# Patient Record
Sex: Female | Born: 2000 | Race: White | Hispanic: No | Marital: Single | Smoking: Former smoker
Health system: Southern US, Community
[De-identification: ages and names within clinical notes are randomized; demographics above are authoritative.]

## PROBLEM LIST (undated history)

## (undated) DIAGNOSIS — J45909 Unspecified asthma, uncomplicated: Secondary | ICD-10-CM

---

## 2018-10-25 ENCOUNTER — Encounter (HOSPITAL_COMMUNITY): Payer: Self-pay | Admitting: Emergency Medicine

## 2018-10-25 ENCOUNTER — Other Ambulatory Visit: Payer: Self-pay

## 2018-10-25 ENCOUNTER — Emergency Department (HOSPITAL_COMMUNITY)
Admission: EM | Admit: 2018-10-25 | Discharge: 2018-10-25 | Disposition: A | Discharge status: Home / Self Care | Payer: Managed Care, Other (non HMO) | Attending: Emergency Medicine | Admitting: Emergency Medicine

## 2018-10-25 ENCOUNTER — Emergency Department (HOSPITAL_COMMUNITY): Payer: Managed Care, Other (non HMO)

## 2018-10-25 DIAGNOSIS — B349 Viral infection, unspecified: Secondary | ICD-10-CM | POA: Diagnosis not present

## 2018-10-25 DIAGNOSIS — Z87891 Personal history of nicotine dependence: Secondary | ICD-10-CM | POA: Diagnosis not present

## 2018-10-25 DIAGNOSIS — R0602 Shortness of breath: Secondary | ICD-10-CM | POA: Insufficient documentation

## 2018-10-25 DIAGNOSIS — Z79899 Other long term (current) drug therapy: Secondary | ICD-10-CM | POA: Insufficient documentation

## 2018-10-25 HISTORY — DX: Unspecified asthma, uncomplicated: J45.909

## 2018-10-25 LAB — CBC WITH DIFFERENTIAL/PLATELET
Abs Immature Granulocytes: 0.03 10*3/uL (ref 0.00–0.07)
Basophils Absolute: 0 10*3/uL (ref 0.0–0.1)
Basophils Relative: 0 %
Eosinophils Absolute: 0.1 10*3/uL (ref 0.0–0.5)
Eosinophils Relative: 1 %
HCT: 40.8 % (ref 36.0–46.0)
Hemoglobin: 12.5 g/dL (ref 12.0–15.0)
Immature Granulocytes: 0 %
Lymphocytes Relative: 30 %
Lymphs Abs: 2.8 10*3/uL (ref 0.7–4.0)
MCH: 27.4 pg (ref 26.0–34.0)
MCHC: 30.6 g/dL (ref 30.0–36.0)
MCV: 89.3 fL (ref 80.0–100.0)
Monocytes Absolute: 0.5 10*3/uL (ref 0.1–1.0)
Monocytes Relative: 6 %
Neutro Abs: 5.6 10*3/uL (ref 1.7–7.7)
Neutrophils Relative %: 63 %
Platelets: 277 10*3/uL (ref 150–400)
RBC: 4.57 MIL/uL (ref 3.87–5.11)
RDW: 12.4 % (ref 11.5–15.5)
WBC: 9.1 10*3/uL (ref 4.0–10.5)
nRBC: 0 % (ref 0.0–0.2)

## 2018-10-25 LAB — BASIC METABOLIC PANEL
Anion gap: 8 (ref 5–15)
BUN: 11 mg/dL (ref 6–20)
CO2: 23 mmol/L (ref 22–32)
Calcium: 9.5 mg/dL (ref 8.9–10.3)
Chloride: 108 mmol/L (ref 98–111)
Creatinine, Ser: 0.73 mg/dL (ref 0.44–1.00)
GFR calc Af Amer: >60 mL/min (ref >60)
GFR calc non Af Amer: >60 mL/min (ref >60)
Glucose, Bld: 100 mg/dL — ABNORMAL HIGH (ref 70–99)
Potassium: 4.9 mmol/L (ref 3.5–5.1)
Sodium: 139 mmol/L (ref 135–145)

## 2018-10-25 LAB — I-STAT BETA HCG BLOOD, ED (MC, WL, AP ONLY): I-stat hCG, quantitative: <5 m[IU]/mL (ref <5)

## 2018-10-25 MED ORDER — SODIUM CHLORIDE 0.9 % IV BOLUS
1000.0000 mL | Freq: Once | INTRAVENOUS | Status: AC
  Administered 2018-10-25: 17:00:00 1000 mL via INTRAVENOUS

## 2018-10-25 NOTE — Discharge Instructions (Addendum)
You likely have a viral illness.  In this pandemic, you could have COVID-19.  However all of your testing appears well and you should self quarantine at home.  If you develop worsening shortness of breath then you should return to the emergency department.  Otherwise, quarantine yourself for 7 days and you must be symptom-free for at least 3 days before he can stop.     Person Under Monitoring Name: Heather Mcdowell  Location: [address] [place] [address]   Infection Prevention Recommendations for Individuals Confirmed to have, or Being Evaluated for, 2019 Novel Coronavirus (COVID-19) Infection Who Receive Care at Home  Individuals who are confirmed to have, or are being evaluated for, COVID-19 should follow the prevention steps below until a healthcare provider or local or state health department says they can return to normal activities.  Stay home except to get medical care You should restrict activities outside your home, except for getting medical care. Do not go to work, school, or public areas, and do not use public transportation or taxis.  Call ahead before visiting your doctor Before your medical appointment, call the healthcare provider and tell them that you have, or are being evaluated for, COVID-19 infection. This will help the healthcare providers office take steps to keep other people from getting infected. Ask your healthcare provider to call the local or state health department.  Monitor your symptoms Seek prompt medical attention if your illness is worsening (e.g., difficulty breathing). Before going to your medical appointment, call the healthcare provider and tell them that you have, or are being evaluated for, COVID-19 infection. Ask your healthcare provider to call the local or state health department.  Wear a facemask You should wear a facemask that covers your nose and mouth when you are in the same room with other people and when you  visit a healthcare provider. People who live with or visit you should also wear a facemask while they are in the same room with you.  Separate yourself from other people in your home As much as possible, you should stay in a different room from other people in your home. Also, you should use a separate bathroom, if available.  Avoid sharing household items You should not share dishes, drinking glasses, cups, eating utensils, towels, bedding, or other items with other people in your home. After using these items, you should wash them thoroughly with soap and water.  Cover your coughs and sneezes Cover your mouth and nose with a tissue when you cough or sneeze, or you can cough or sneeze into your sleeve. Throw used tissues in a lined trash can, and immediately wash your hands with soap and water for at least 20 seconds or use an alcohol-based hand rub.  Wash your [employer] your hands often and thoroughly with soap and water for at least 20 seconds. You can use an alcohol-based hand sanitizer if soap and water are not available and if your hands are not visibly dirty. Avoid touching your eyes, nose, and mouth with unwashed hands.   Prevention Steps for Caregivers and Household Members of Individuals Confirmed to have, or Being Evaluated for, COVID-19 Infection Being Cared for in the Home  If you live with, or provide care at home for, a person confirmed to have, or being evaluated for, COVID-19 infection please follow these guidelines to prevent infection:  Follow healthcare providers instructions Make sure that you understand and can help the patient follow any healthcare  provider instructions for all care.  Provide for the patients basic needs You should help the patient with basic needs in the home and provide support for getting groceries, prescriptions, and other personal needs.  Monitor the patients symptoms If they are getting sicker, call his or her medical provider and  tell them that the patient has, or is being evaluated for, COVID-19 infection. This will help the healthcare providers office take steps to keep other people from getting infected. Ask the healthcare provider to call the local or state health department.  Limit the number of people who have contact with the patient If possible, have only one caregiver for the patient. Other household members should stay in another home or place of residence. If this is not possible, they should stay in another room, or be separated from the patient as much as possible. Use a separate bathroom, if available. Restrict visitors who do not have an essential need to be in the home.  Keep older adults, very young children, and other sick people away from the patient Keep older adults, very young children, and those who have compromised immune systems or chronic health conditions away from the patient. This includes people with chronic heart, lung, or kidney conditions, diabetes, and cancer.  Ensure good ventilation Make sure that shared spaces in the home have good air flow, such as from an air conditioner or an opened window, weather permitting.  Wash your hands often Wash your hands often and thoroughly with soap and water for at least 20 seconds. You can use an alcohol based hand sanitizer if soap and water are not available and if your hands are not visibly dirty. Avoid touching your eyes, nose, and mouth with unwashed hands. Use disposable paper towels to dry your hands. If not available, use dedicated cloth towels and replace them when they become wet.  Wear a facemask and gloves Wear a disposable facemask at all times in the room and gloves when you touch or have contact with the patients blood, body fluids, and/or secretions or excretions, such as sweat, saliva, sputum, nasal mucus, vomit, urine, or feces.  Ensure the mask fits over your nose and mouth tightly, and do not touch it during use. Throw out  disposable facemasks and gloves after using them. Do not reuse. Wash your hands immediately after removing your facemask and gloves. If your personal clothing becomes contaminated, carefully remove clothing and launder. Wash your hands after handling contaminated clothing. Place all used disposable facemasks, gloves, and other waste in a lined container before disposing them with other household waste. Remove gloves and wash your hands immediately after handling these items.  Do not share dishes, glasses, or other household items with the patient Avoid sharing household items. You should not share dishes, drinking glasses, cups, eating utensils, towels, bedding, or other items with a patient who is confirmed to have, or being evaluated for, COVID-19 infection. After the person uses these items, you should wash them thoroughly with soap and water.  Wash laundry thoroughly Immediately remove and wash clothes or bedding that have blood, body fluids, and/or secretions or excretions, such as sweat, saliva, sputum, nasal mucus, vomit, urine, or feces, on them. Wear gloves when handling laundry from the patient. Read and follow directions on labels of laundry or clothing items and detergent. In general, wash and dry with the warmest temperatures recommended on the label.  Clean all areas the individual has used often Clean all touchable surfaces, such as counters, tabletops, doorknobs,  bathroom fixtures, toilets, phones, keyboards, tablets, and bedside tables, every day. Also, clean any surfaces that may have blood, body fluids, and/or secretions or excretions on them. Wear gloves when cleaning surfaces the patient has come in contact with. Use a diluted bleach solution (e.g., dilute bleach with 1 part bleach and 10 parts water) or a household disinfectant with a label that says EPA-registered for coronaviruses. To make a bleach solution at home, add 1 tablespoon of bleach to 1 quart (4 cups) of water.  For a larger supply, add  cup of bleach to 1 gallon (16 cups) of water. Read labels of cleaning products and follow recommendations provided on product labels. Labels contain instructions for safe and effective use of the cleaning product including precautions you should take when applying the product, such as wearing gloves or eye protection and making sure you have good ventilation during use of the product. Remove gloves and wash hands immediately after cleaning.  Monitor yourself for signs and symptoms of illness Caregivers and household members are considered close contacts, should monitor their health, and will be asked to limit movement outside of the home to the extent possible. Follow the monitoring steps for close contacts listed on the symptom monitoring form.   ? If you have additional questions, contact your local health department or call the epidemiologist on call at [phone] (available 24/7). ? This guidance is subject to change. For the most up-to-date guidance from [facility], please refer to their website: [email or web address]

## 2018-10-25 NOTE — ED Triage Notes (Addendum)
Pt reports 4 days ago diarrhea with abd cramping, sore throat, reports weight on her chest today and SOB, itchy throat and occasional congested cough, fever of 99.

## 2018-10-25 NOTE — ED Provider Notes (Signed)
[facility] EMERGENCY DEPARTMENT Provider Note   CSN: [id number] Arrival date & time: 10/25/18  1554    History   Chief Complaint Chief Complaint  Patient presents with  . Shortness of Breath  . Sore Throat    HPI Heather Mcdowell is a 18 y.o. female.     HPI  18 year old female presents with diarrhea and shortness of breath.  She states she is been feeling poorly for 5 days with numerous diarrheal bowel movements.  She also has abdominal cramping that comes only when she is having a bowel movement.  Some nausea without vomiting.  No fevers except low-grade 99 temperature.  A little bit of trace cough and some chest pressure with shortness of breath over the last day or so.  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  Past Medical History:  Diagnosis Date  . Asthma    as a child    There are no active problems to display for this patient.   History reviewed. No pertinent surgical history.   OB History    Gravida  1   Para      Term      Preterm      AB      Living        SAB      TAB      Ectopic      Multiple      Live Births               Home Medications    Prior to Admission medications   Medication Sig Start Date End Date Taking? Authorizing Provider  BIOTIN PO Take 1 tablet by mouth daily.   Yes [provider]  Etonogestrel (NEXPLANON [place]) Inject into the skin. June 2019   Yes [provider]    Family History History reviewed. No pertinent family history.  Social History Social History   Tobacco Use  . Smoking status: Former Smoker    Types: Cigarettes  . Smokeless tobacco: Never Used  . Tobacco comment: quit 2018  Substance Use Topics  . Alcohol use: Not Currently  . Drug use: Never     Allergies   Patient has no known allergies.   Review of Systems Review of Systems  Constitutional: Positive for fatigue. Negative for  fever.  Respiratory: Positive for cough and shortness of breath.   Cardiovascular: Positive for chest pain.  Gastrointestinal: Positive for abdominal pain, diarrhea and nausea. Negative for vomiting.  Neurological: Positive for weakness (generalized). Negative for light-headedness.  All other systems reviewed and are negative.    Physical Exam Updated Vital Signs BP (!) 110/59 (BP Location: Right Arm)   Pulse 63 Comment: Simultaneous filing. User may not have seen previous data.  Temp 98.3 F (36.8 C) (Oral)   Resp 16   LMP 10/25/2018 Comment: implant in arm, constant period x 2 months.   SpO2 98% Comment: Simultaneous filing. User may not have seen previous data.  Breastfeeding Unknown   Physical Exam Vitals signs and nursing note reviewed.  Constitutional:      General: She is not in acute distress.    Appearance: She is well-developed. She is not ill-appearing or diaphoretic.  HENT:     Head: Normocephalic and atraumatic.     Right Ear: External ear normal.     Left Ear: External ear normal.     Nose: Nose normal.  Eyes:     General:        Right eye: No discharge.        Left eye: No discharge.  Cardiovascular:     Rate and Rhythm: Normal rate and regular rhythm.     Heart sounds: Normal heart sounds.  Pulmonary:     Effort: Pulmonary effort is normal. No tachypnea or accessory muscle usage.  Abdominal:     General: There is no distension.     Palpations: Abdomen is soft.     Tenderness: There is no abdominal tenderness.  Skin:    General: Skin is warm and dry.  Neurological:     Mental Status: She is alert.  Psychiatric:        Mood and Affect: Mood is not anxious.      ED Treatments / Results  Labs (all labs ordered are listed, but only abnormal results are displayed) Labs Reviewed  BASIC METABOLIC PANEL - Abnormal; Notable for the following components:      Result Value   Glucose, Bld 100 (*)    All other components within normal limits  CBC WITH  DIFFERENTIAL/PLATELET  I-STAT BETA HCG BLOOD, ED ([facility], [facility], [facility] ONLY)    EKG EKG Interpretation  Date/Time:  Wednesday October 25 2018 16:37:43 EDT Ventricular Rate:  59 PR Interval:    QRS Duration: 97 QT Interval:  400 QTC Calculation: 397 R Axis:   78 Text Interpretation:  Sinus bradycardia Baseline wander in lead(s) I no acute ST/T changes No old tracing to compare Confirmed by Pricilla Loveless [phone]) on 10/25/2018 4:45:59 PM   Radiology Dg Chest Portable 1 View  Result Date: 10/25/2018 CLINICAL DATA:  Shortness of breath and sore throat EXAM: PORTABLE CHEST 1 VIEW COMPARISON:  None. FINDINGS: Lungs are clear. The heart size and pulmonary vascularity are normal. No adenopathy. No pneumothorax. No bone lesions. IMPRESSION: No abnormality noted. Electronically Signed   By: Bretta Bang III M.D.   On: 10/25/2018 16:47    Procedures Procedures (including critical care time)  Medications Ordered in ED Medications  sodium chloride 0.9 % bolus 1,000 mL (1,000 mLs Intravenous New Bag/Given 10/25/18 1654)     Initial Impression / Assessment and Plan / ED Course  I have reviewed the triage vital signs and the nursing notes.  Pertinent labs & imaging results that were available during my care of the patient were reviewed by me and considered in my medical decision making (see chart for details).        Patient appears well and has stable vital signs.  Given her dizziness and fatigue, labs were evaluated and are benign.  Chest x-ray clear.  No increased work of breathing.  She could have mild COVID-19 or other viral illness and she has been asked to self quarantine.  However she does not appear to need immediate airway management, oxygenation, or admission.  Discharge home with return precautions.  Heather Mcdowell was evaluated in Emergency Department on 10/25/2018 for the symptoms described in the history of present illness. She was evaluated in the context of the global COVID-19  pandemic, which necessitated consideration that the patient might be at risk for infection with the SARS-CoV-2 virus that causes COVID-19. Institutional protocols and algorithms that pertain to the evaluation of patients at risk for COVID-19 are in a state of rapid change based on information released by regulatory bodies including the CDC and federal and state organizations. These policies and algorithms were followed during the patient's care  in the ED.   Final Clinical Impressions(s) / ED Diagnoses   Final diagnoses:  Shortness of breath  Viral illness    ED Discharge Orders    None       Pricilla Loveless, MD 10/25/18 [phone]

## 2019-02-28 ENCOUNTER — Emergency Department (HOSPITAL_COMMUNITY)
Admission: EM | Admit: 2019-02-28 | Discharge: 2019-03-01 | Disposition: A | Discharge status: Home / Self Care | Payer: Managed Care, Other (non HMO) | Attending: Emergency Medicine | Admitting: Emergency Medicine

## 2019-02-28 ENCOUNTER — Other Ambulatory Visit: Payer: Self-pay

## 2019-02-28 ENCOUNTER — Emergency Department (HOSPITAL_COMMUNITY): Payer: Managed Care, Other (non HMO)

## 2019-02-28 DIAGNOSIS — X500XXA Overexertion from strenuous movement or load, initial encounter: Secondary | ICD-10-CM | POA: Insufficient documentation

## 2019-02-28 DIAGNOSIS — Y939 Activity, unspecified: Secondary | ICD-10-CM | POA: Diagnosis not present

## 2019-02-28 DIAGNOSIS — Z79899 Other long term (current) drug therapy: Secondary | ICD-10-CM | POA: Diagnosis not present

## 2019-02-28 DIAGNOSIS — Y929 Unspecified place or not applicable: Secondary | ICD-10-CM | POA: Diagnosis not present

## 2019-02-28 DIAGNOSIS — S99912A Unspecified injury of left ankle, initial encounter: Secondary | ICD-10-CM | POA: Diagnosis present

## 2019-02-28 DIAGNOSIS — S93402A Sprain of unspecified ligament of left ankle, initial encounter: Secondary | ICD-10-CM | POA: Diagnosis not present

## 2019-02-28 DIAGNOSIS — Y999 Unspecified external cause status: Secondary | ICD-10-CM | POA: Insufficient documentation

## 2019-02-28 DIAGNOSIS — Z87891 Personal history of nicotine dependence: Secondary | ICD-10-CM | POA: Diagnosis not present

## 2019-02-28 MED ORDER — OXYCODONE-ACETAMINOPHEN 5-325 MG PO TABS
1.0000 | ORAL_TABLET | ORAL | Status: DC | PRN
  Administered 2019-02-28: 1 via ORAL
  Filled 2019-02-28: qty 1

## 2019-02-28 NOTE — ED Triage Notes (Signed)
Pt BIB [facility] for eval of L ankle pain, rolled ankle on last step. Pt reports she felt crack when. +CSM to L foot, swelling to L lateral ankle.

## 2019-03-01 MED ORDER — NAPROXEN 250 MG PO TABS
500.0000 mg | ORAL_TABLET | Freq: Once | ORAL | Status: AC
  Administered 2019-03-01: 500 mg via ORAL
  Filled 2019-03-01: qty 2

## 2019-03-01 MED ORDER — NAPROXEN 500 MG PO TABS: 500.0000 mg | ORAL_TABLET | Freq: Two times a day (BID) | ORAL | 0 refills | Status: AC | PRN

## 2019-03-01 NOTE — ED Provider Notes (Signed)
[facility] EMERGENCY DEPARTMENT Provider Note   CSN: [id number] Arrival date & time: 02/28/19  2015     History   Chief Complaint Chief Complaint  Patient presents with  . Ankle Pain    HPI Heather Mcdowell is a 18 y.o. female.     The history is provided by the patient. No language interpreter was used.  Ankle Pain Location:  Ankle Time since incident:  5 hours Injury: yes   Mechanism of injury comment:  Twisted ankle stepping off a step Ankle location:  L ankle Pain details:    Radiates to:  Does not radiate   Severity:  Moderate   Onset quality:  Sudden   Timing:  Constant   Progression:  Unchanged Chronicity:  New Dislocation: no   Prior injury to area:  No Relieved by: Percocet in triage. Worsened by:  Bearing weight Ineffective treatments:  Rest Associated symptoms: swelling   Associated symptoms: no decreased ROM and no tingling   Risk factors: no frequent fractures and no known bone disorder     Past Medical History:  Diagnosis Date  . Asthma    as a child    There are no active problems to display for this patient.   No past surgical history on file.   OB History    Gravida  1   Para      Term      Preterm      AB      Living        SAB      TAB      Ectopic      Multiple      Live Births               Home Medications    Prior to Admission medications   Medication Sig Start Date End Date Taking? Authorizing Provider  BIOTIN PO Take 1 tablet by mouth daily.    [provider]  Etonogestrel (NEXPLANON [place]) Inject into the skin. June 2019    [provider]  naproxen (NAPROSYN) 500 MG tablet Take 1 tablet (500 mg total) by mouth every 12 (twelve) hours as needed for mild pain or moderate pain. 03/01/19   Antonietta Breach, PA-C    Family History No family history on file.  Social History Social History   Tobacco Use  . Smoking status: Former Smoker    Types: Cigarettes  .  Smokeless tobacco: Never Used  . Tobacco comment: quit 2018  Substance Use Topics  . Alcohol use: Not Currently  . Drug use: Never     Allergies   Patient has no known allergies.   Review of Systems Review of Systems Ten systems reviewed and are negative for acute change, except as noted in the HPI.    Physical Exam Updated Vital Signs BP 105/63 (BP Location: Right Arm)   Pulse 90   Temp 98.6 F (37 C) (Oral)   Resp 15   Ht 5\' 2"  (1.575 m)   Wt 81.6 kg   SpO2 100%   BMI 32.92 kg/m   Physical Exam Vitals signs and nursing note reviewed.  Constitutional:      General: She is not in acute distress.    Appearance: She is well-developed. She is not diaphoretic.     Comments: Nontoxic appearing and in NAD  HENT:     Head: Normocephalic and atraumatic.  Eyes:     General: No scleral icterus.  Conjunctiva/sclera: Conjunctivae normal.  Neck:     Musculoskeletal: Normal range of motion.  Cardiovascular:     Rate and Rhythm: Normal rate and regular rhythm.     Pulses: Normal pulses.     Comments: DP pulse 2+ in the left lower extremity Pulmonary:     Effort: Pulmonary effort is normal. No respiratory distress.     Comments: Respirations even and unlabored Musculoskeletal:     Comments: Preserved active and passive range of motion of the left ankle.  There is tenderness to palpation of the lateral malleolus as well as the Achilles tendon.  Negative Thompson test.  No bony deformity or crepitus.  Skin:    General: Skin is warm and dry.     Coloration: Skin is not pale.     Findings: No erythema or rash.  Neurological:     General: No focal deficit present.     Mental Status: She is alert and oriented to person, place, and time.     Coordination: Coordination normal.  Psychiatric:        Behavior: Behavior normal.      ED Treatments / Results  Labs (all labs ordered are listed, but only abnormal results are displayed) Labs Reviewed - No data to display  EKG  None  Radiology Dg Ankle Complete Left  Result Date: 02/28/2019 CLINICAL DATA:  Left ankle pain, rolled, felt crack EXAM: LEFT ANKLE COMPLETE - 3+ VIEW COMPARISON:  None. FINDINGS: There is soft tissue swelling over the lateral malleolus without joint effusion, acute fracture, or traumatic malalignment of the left ankle. Ankle mortise is congruent. Bone mineralization is appropriate. Remaining soft tissues are unremarkable. IMPRESSION: Soft tissue swelling over the lateral malleolus. No acute osseous abnormality. Electronically Signed   By: Kreg ShropshirePrice  DeHay M.D.   On: 02/28/2019 21:25   Dg Foot Complete Left  Result Date: 02/28/2019 CLINICAL DATA:  Left ankle pain, rolled on step, felt crack at time of injury EXAM: LEFT FOOT - COMPLETE 3+ VIEW COMPARISON:  Same day ankle radiographs FINDINGS: No acute fracture or traumatic malalignment. Redemonstration of lateral ankle soft tissue swelling without subjacent osseous abnormality. Midfoot and hindfoot alignment is grossly preserved though incompletely assessed on nonweightbearing films. IMPRESSION: Mild lateral ankle swelling. No acute fracture or traumatic malalignment in the foot. Electronically Signed   By: Kreg ShropshirePrice  DeHay M.D.   On: 02/28/2019 21:27    Procedures Procedures (including critical care time)  Medications Ordered in ED Medications  oxyCODONE-acetaminophen (PERCOCET/ROXICET) 5-325 MG per tablet 1 tablet (1 tablet Oral Given 02/28/19 2025)  naproxen (NAPROSYN) tablet 500 mg (has no administration in time range)     Initial Impression / Assessment and Plan / ED Course  I have reviewed the triage vital signs and the nursing notes.  Pertinent labs & imaging results that were available during my care of the patient were reviewed by me and considered in my medical decision making (see chart for details).        Patient presents to the emergency department for evaluation of L ankle pain.  Patient stepped wrong off of a step, twisting her  ankle prior to arrival patient neurovascularly intact on exam. Imaging negative for fracture, dislocation, bony deformity. No erythema, heat to touch to the affected area; no concern for septic joint. Compartments in the affected extremity are soft.   She has a negative Thompson test, but is quite tender on palpation to the Achilles.  Question strain versus partial rupture.  Will place in a  cam walker as a precaution.  Plan for supportive management including RICE and NSAIDs; referral to Orthopedics. Return precautions discussed and provided. Patient discharged in stable condition with no unaddressed concerns.   Final Clinical Impressions(s) / ED Diagnoses   Final diagnoses:  Sprain of left ankle, unspecified ligament, initial encounter    ED Discharge Orders         Ordered    naproxen (NAPROSYN) 500 MG tablet  Every 12 hours PRN     03/01/19 0056           Antony MaduraHumes, [place], PA-C 03/01/19 0106    Derwood KaplanNanavati, Ankit, MD 03/07/19 1513

## 2019-03-01 NOTE — ED Notes (Signed)
Pt discharged from ED; instructions provided and scripts given; Pt encouraged to return to ED if symptoms worsen and to f/u with PCP; Pt verbalized understanding of all instructions 

## 2019-03-01 NOTE — Discharge Instructions (Signed)
Ice to areas of injury 3-4 times per day for 15 to 20 minutes each time to limit swelling.  We recommend naproxen as prescribed for pain control.  Use a cam walker for stability and crutches to prevent from putting weight on your left foot/ankle.  We recommend close follow-up with orthopedics to ensure proper healing of your ankle sprain.  You may return to the ED for any new or concerning symptoms.

## 2020-06-09 IMAGING — DX LEFT ANKLE COMPLETE - 3+ VIEW
3 series · 3 of 3 positions shown · non-contrast
Comparison: None.

CLINICAL DATA: Left ankle pain, rolled, felt crack

EXAM:
LEFT ANKLE COMPLETE - 3+ VIEW

[x ankle ap left]
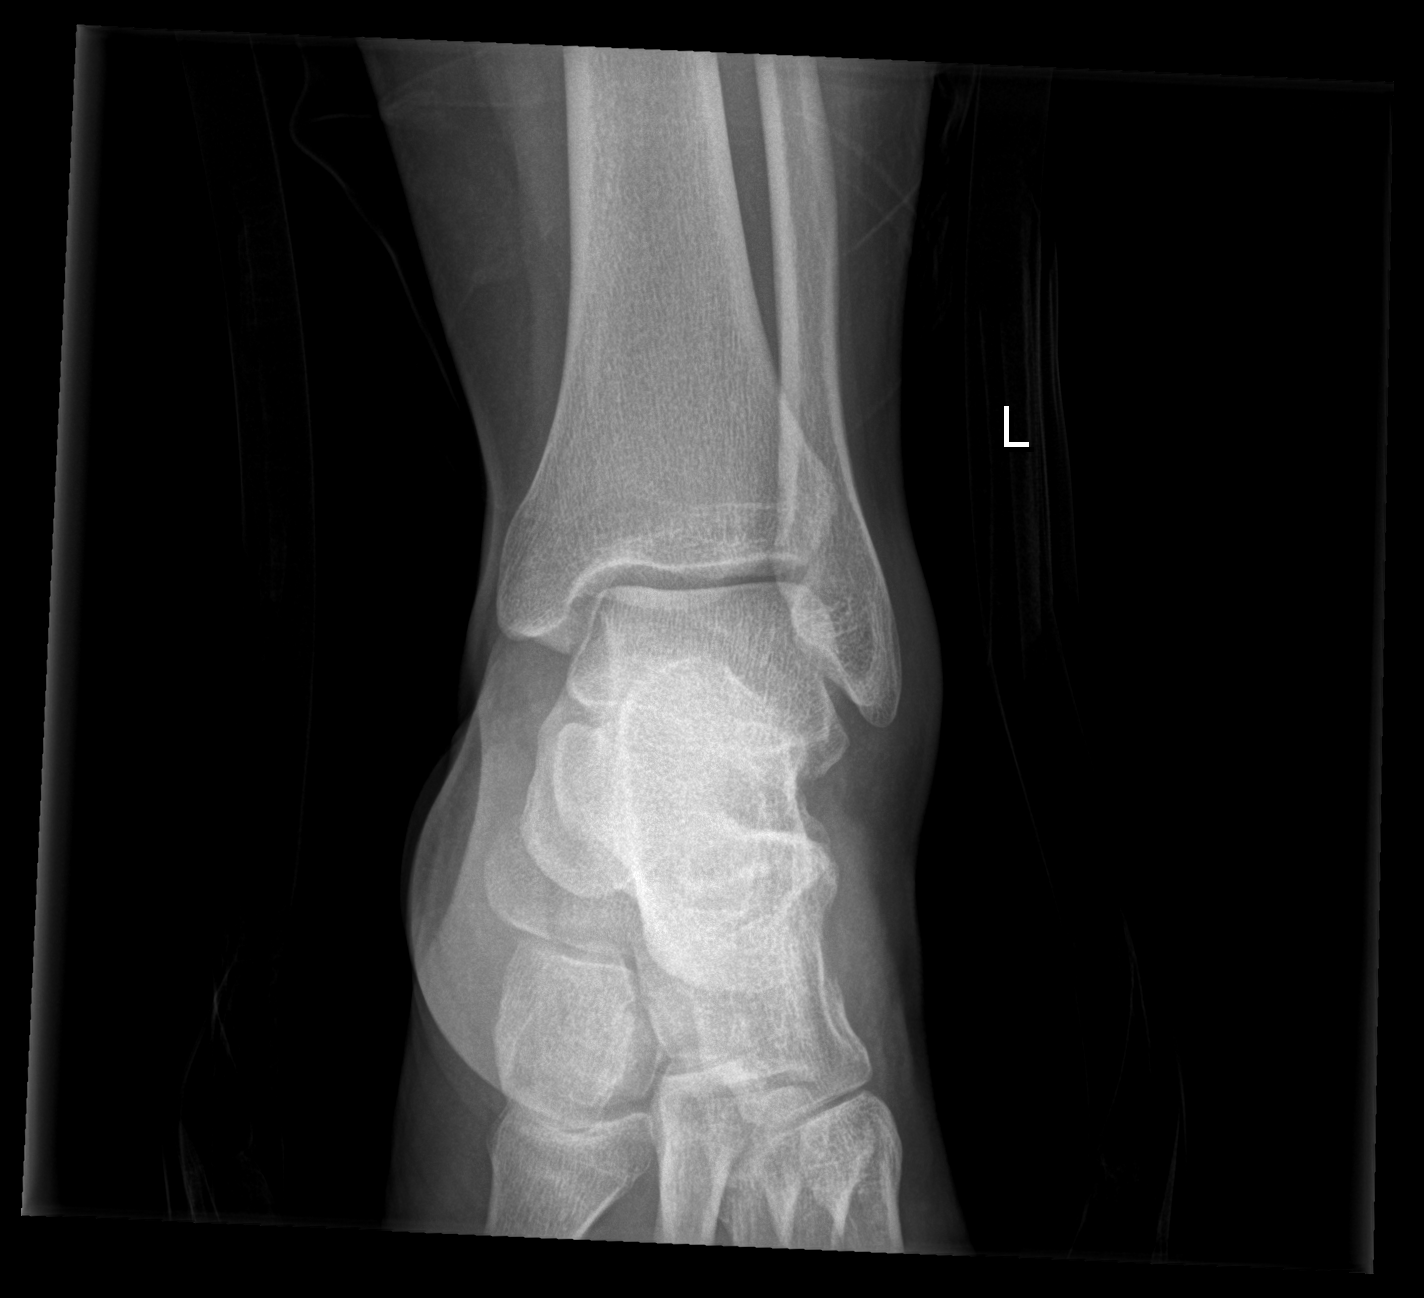

[x ankle obl left]
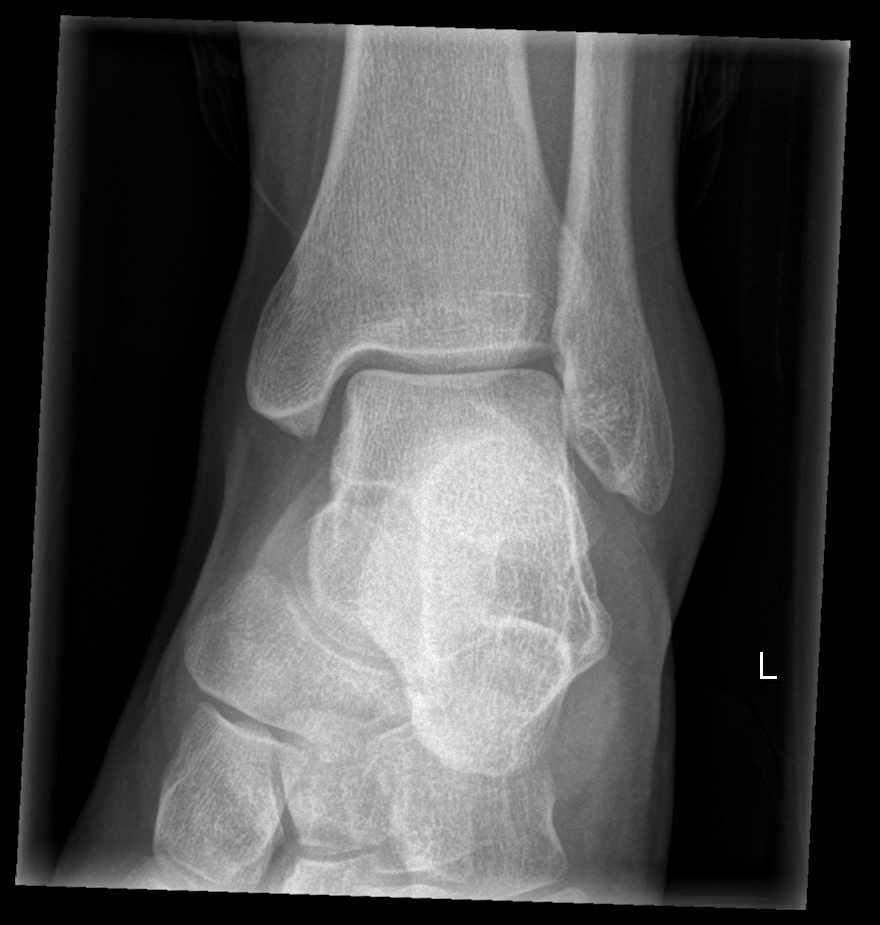

[x ankle lat left]
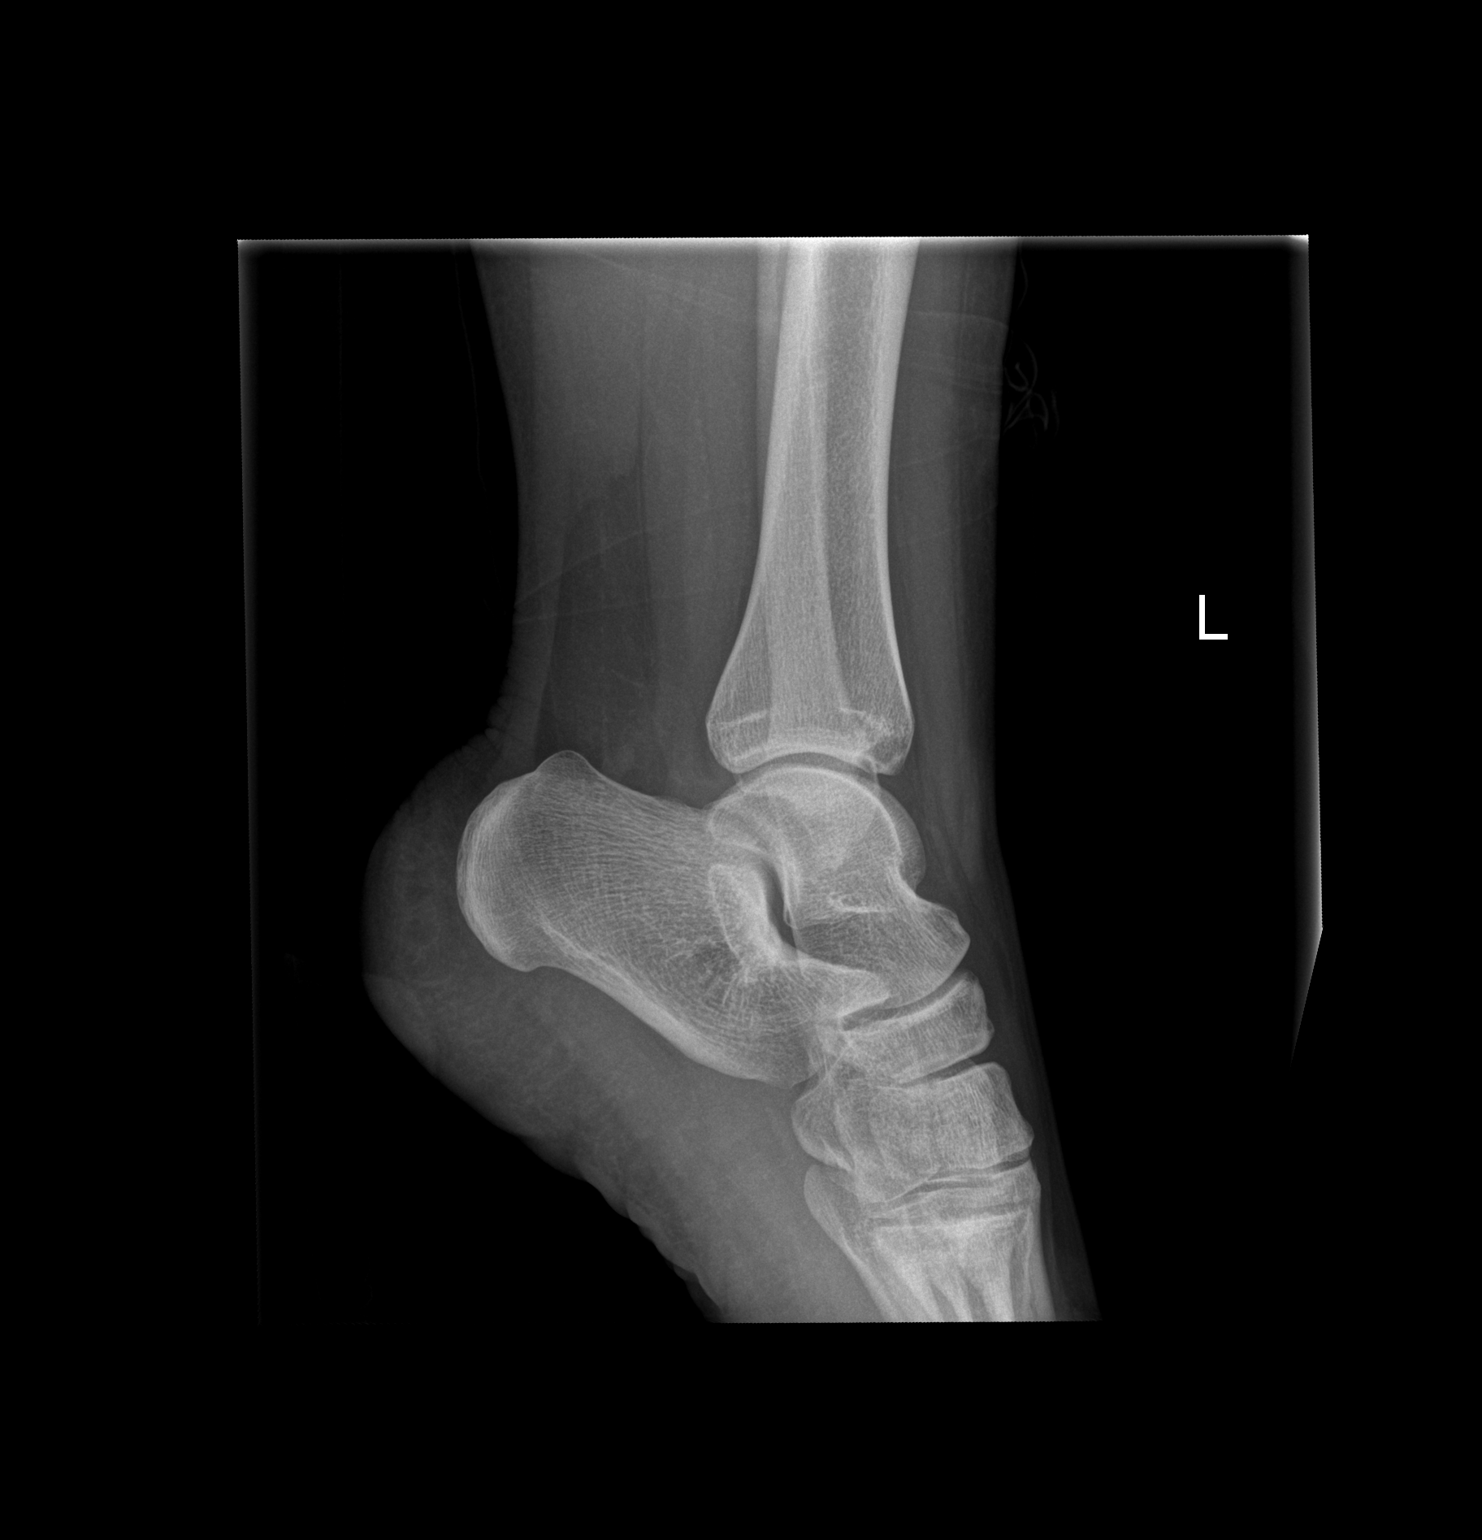

[3 of 3 positions shown; findings below may reference images not displayed]

FINDINGS: There is soft tissue swelling over the lateral malleolus without
joint effusion, acute fracture, or traumatic malalignment of the
left ankle. Ankle mortise is congruent. Bone mineralization is
appropriate. Remaining soft tissues are unremarkable.
IMPRESSION: Soft tissue swelling over the lateral malleolus. No acute osseous
abnormality.
